# Patient Record
Sex: Female | Born: 1950 | Race: White | Hispanic: No
Health system: Southern US, Community
[De-identification: ages and names within clinical notes are randomized; demographics above are authoritative.]

---

## 1998-03-09 ENCOUNTER — Encounter: Admission: RE | Admit: 1998-03-09 | Discharge: 1998-03-09 | Payer: Self-pay | Admitting: Family Medicine

## 1998-04-20 ENCOUNTER — Encounter: Admission: RE | Admit: 1998-04-20 | Discharge: 1998-04-20 | Payer: Self-pay | Admitting: Family Medicine

## 1999-02-23 ENCOUNTER — Ambulatory Visit (HOSPITAL_COMMUNITY): Admission: RE | Admit: 1999-02-23 | Discharge: 1999-02-23 | Payer: Self-pay | Admitting: Gastroenterology

## 1999-05-18 ENCOUNTER — Other Ambulatory Visit: Admission: RE | Admit: 1999-05-18 | Discharge: 1999-05-18 | Payer: Self-pay | Admitting: Obstetrics and Gynecology

## 1999-06-05 ENCOUNTER — Encounter: Payer: Self-pay | Admitting: Neurosurgery

## 1999-06-05 ENCOUNTER — Ambulatory Visit (HOSPITAL_COMMUNITY): Admission: RE | Admit: 1999-06-05 | Discharge: 1999-06-05 | Payer: Self-pay | Admitting: Neurosurgery

## 2000-05-17 ENCOUNTER — Other Ambulatory Visit: Admission: RE | Admit: 2000-05-17 | Discharge: 2000-05-17 | Payer: Self-pay | Admitting: Obstetrics and Gynecology

## 2000-06-19 ENCOUNTER — Ambulatory Visit (HOSPITAL_COMMUNITY): Admission: RE | Admit: 2000-06-19 | Discharge: 2000-06-19 | Payer: Self-pay | Admitting: Neurosurgery

## 2000-06-19 ENCOUNTER — Encounter: Payer: Self-pay | Admitting: Neurosurgery

## 2001-05-01 ENCOUNTER — Encounter: Admission: RE | Admit: 2001-05-01 | Discharge: 2001-05-01 | Payer: Self-pay | Admitting: Sports Medicine

## 2001-05-09 ENCOUNTER — Other Ambulatory Visit: Admission: RE | Admit: 2001-05-09 | Discharge: 2001-05-09 | Payer: Self-pay | Admitting: Obstetrics and Gynecology

## 2001-06-20 ENCOUNTER — Encounter: Payer: Self-pay | Admitting: Neurosurgery

## 2001-06-20 ENCOUNTER — Ambulatory Visit (HOSPITAL_COMMUNITY): Admission: RE | Admit: 2001-06-20 | Discharge: 2001-06-20 | Payer: Self-pay | Admitting: Neurosurgery

## 2001-08-14 ENCOUNTER — Encounter: Payer: Self-pay | Admitting: Neurosurgery

## 2001-08-14 ENCOUNTER — Ambulatory Visit (HOSPITAL_COMMUNITY): Admission: RE | Admit: 2001-08-14 | Discharge: 2001-08-14 | Payer: Self-pay | Admitting: Neurosurgery

## 2002-03-19 ENCOUNTER — Ambulatory Visit (HOSPITAL_COMMUNITY): Admission: RE | Admit: 2002-03-19 | Discharge: 2002-03-19 | Payer: Self-pay | Admitting: Gastroenterology

## 2002-05-14 ENCOUNTER — Other Ambulatory Visit: Admission: RE | Admit: 2002-05-14 | Discharge: 2002-05-14 | Payer: Self-pay | Admitting: Obstetrics and Gynecology

## 2003-06-04 ENCOUNTER — Other Ambulatory Visit: Admission: RE | Admit: 2003-06-04 | Discharge: 2003-06-04 | Payer: Self-pay | Admitting: Obstetrics and Gynecology

## 2004-06-11 ENCOUNTER — Other Ambulatory Visit: Admission: RE | Admit: 2004-06-11 | Discharge: 2004-06-11 | Payer: Self-pay | Admitting: Obstetrics and Gynecology

## 2005-07-06 ENCOUNTER — Other Ambulatory Visit: Admission: RE | Admit: 2005-07-06 | Discharge: 2005-07-06 | Payer: Self-pay | Admitting: Obstetrics and Gynecology

## 2005-08-10 ENCOUNTER — Other Ambulatory Visit: Admission: RE | Admit: 2005-08-10 | Discharge: 2005-08-10 | Payer: Self-pay | Admitting: Obstetrics and Gynecology

## 2006-02-14 ENCOUNTER — Other Ambulatory Visit: Admission: RE | Admit: 2006-02-14 | Discharge: 2006-02-14 | Payer: Self-pay | Admitting: Obstetrics and Gynecology

## 2006-07-07 ENCOUNTER — Other Ambulatory Visit: Admission: RE | Admit: 2006-07-07 | Discharge: 2006-07-07 | Payer: Self-pay | Admitting: Obstetrics and Gynecology

## 2007-01-05 ENCOUNTER — Other Ambulatory Visit: Admission: RE | Admit: 2007-01-05 | Discharge: 2007-01-05 | Payer: Self-pay | Admitting: Obstetrics and Gynecology

## 2007-07-10 ENCOUNTER — Other Ambulatory Visit: Admission: RE | Admit: 2007-07-10 | Discharge: 2007-07-10 | Payer: Self-pay | Admitting: Obstetrics & Gynecology

## 2008-07-11 ENCOUNTER — Other Ambulatory Visit: Admission: RE | Admit: 2008-07-11 | Discharge: 2008-07-11 | Payer: Self-pay | Admitting: Obstetrics and Gynecology

## 2011-02-01 ENCOUNTER — Other Ambulatory Visit: Payer: Self-pay | Admitting: Radiology

## 2011-02-11 NOTE — Op Note (Signed)
[place]. [facility]  Patient:    KALISI, BEVILL Visit Number: [id number] MRN: [id number]          Service Type: END Location: ENDO Attending Physician:  Orland Mustard Dictated by:   Llana Aliment. Randa Evens, M.D. Proc. Date: 03/19/02 Admit Date:  03/19/2002   CC:         Cynthia P. Ashley Royalty, M.D.  Carolyne Fiscal, M.D.   Operative Report  PROCEDURE:  Colonoscopy.  MEDICATIONS: Fentanyl 125 mcg, Versed 12.5 mg.  SCOPE: Olympus pediatric video colonoscope.  INDICATIONS: Previous history of adenomatous polyps. This is known as the three year followup.  DESCRIPTION OF THE PROCEDURE:  The procedure had been explained to the patient and consent was obtained. The patient was placed in the left lateral decubitus position. The Olympus pediatric video colonoscope was inserted and advanced under direct visualization. The prep was excellent and we were able to reach the cecum using abdominal pressure and position change.  The ileocecal valve and the appendiceal orifice were seen. The scope was withdrawn and the cement and ascending colon, hepatic flexure, transverse colon, splenic flexure, descending and sigmoid colon were seen well upon removal. No polyps were seen. There is no significant diverticular disease.  The scope was withdrawn. The patient tolerated the procedure well.  ASSESSMENT: No evidence of colon polyps.  PLAN: Will recommend repeating the procedure in five years. Dictated by:   Llana Aliment. Randa Evens, M.D. Attending Physician:  Orland Mustard DD:  03/19/02 TD:  03/19/02 Job: [id number]

## 2013-01-15 ENCOUNTER — Telehealth: Payer: Self-pay | Admitting: Obstetrics and Gynecology

## 2013-01-15 NOTE — Telephone Encounter (Signed)
Pt says she need an order for a diagnostic mammogram.

## 2013-01-15 NOTE — Telephone Encounter (Signed)
Spoke to pt who is due for a MMG next month. She had a needle biopsy last May at [facility] and was released from Q25month MMG's last May. She was told this upcoming MMG needed to be a dx. OK to send order for dx to [facility]? Pt has appt 02-13-13.

## 2013-01-15 NOTE — Telephone Encounter (Signed)
Order sent to [facility] for diagnostic Mammogram. Patient notified. sue

## 2013-01-15 NOTE — Telephone Encounter (Signed)
Yes but I would double check with [facility] to make sure the order is what they need.  Thanks.

## 2013-01-16 ENCOUNTER — Telehealth: Payer: Self-pay | Admitting: *Deleted

## 2013-07-19 ENCOUNTER — Ambulatory Visit: Payer: Self-pay | Admitting: Obstetrics and Gynecology

## 2014-07-08 DIAGNOSIS — Z0289 Encounter for other administrative examinations: Secondary | ICD-10-CM

## 2014-07-10 ENCOUNTER — Telehealth: Payer: Self-pay | Admitting: Obstetrics and Gynecology

## 2014-07-10 NOTE — Telephone Encounter (Signed)
Left msg for pt to call regarding her records. Total fee will be $51.25 and when would she like to pick them up?

## 2014-07-10 NOTE — Telephone Encounter (Signed)
THANK YOU

## 2014-07-10 NOTE — Telephone Encounter (Signed)
Pt will pick up records 07/15/14 or 07/16/14

## 2014-07-15 DIAGNOSIS — Z0289 Encounter for other administrative examinations: Secondary | ICD-10-CM

## 2016-01-01 ENCOUNTER — Other Ambulatory Visit: Payer: Self-pay | Admitting: Gastroenterology

## 2016-01-01 DIAGNOSIS — R1084 Generalized abdominal pain: Secondary | ICD-10-CM

## 2016-01-01 DIAGNOSIS — R194 Change in bowel habit: Secondary | ICD-10-CM

## 2016-01-13 ENCOUNTER — Ambulatory Visit
Admission: RE | Admit: 2016-01-13 | Discharge: 2016-01-13 | Disposition: A | Discharge status: Home / Self Care | Payer: Medicare Other | Source: Ambulatory Visit | Attending: Gastroenterology | Admitting: Gastroenterology

## 2016-01-13 ENCOUNTER — Other Ambulatory Visit: Payer: Self-pay

## 2016-01-13 DIAGNOSIS — R194 Change in bowel habit: Secondary | ICD-10-CM

## 2016-01-13 DIAGNOSIS — R1084 Generalized abdominal pain: Secondary | ICD-10-CM

## 2016-01-13 IMAGING — CT CT ABD-PELV W/ CM
1 of 3 series · 14 of 32 positions shown, 19 images · IV contrast (APPLIED)
Comparison: None.

CLINICAL DATA: Generalized abd pain change in bowel habits Bloating
and constipation Since [DATE]

EXAM:
CT ABDOMEN AND PELVIS WITH CONTRAST
TECHNIQUE: Multidetector CT imaging of the abdomen and pelvis was performed
using the standard protocol following bolus administration of
intravenous contrast.
CONTRAST:  100mL [CX] IOPAMIDOL ([CX]) INJECTION 61%

[Series 2: abd/pelvis w/cm · axial · 0.69mm/px · z∈[-436,-51]mm · 14 of 87 slices shown, 19 images]
[im 5/87  soft-tissue]
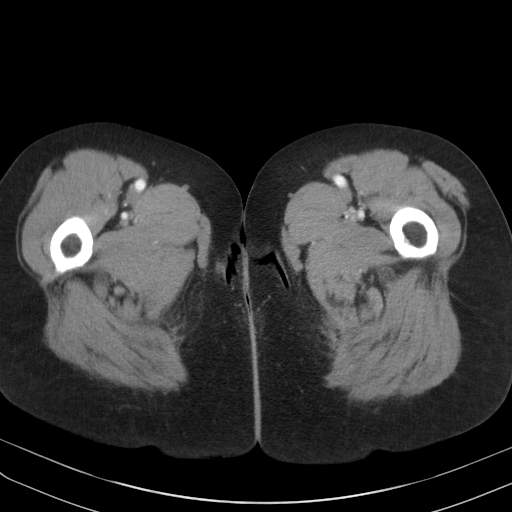
[im 5/87  bone]
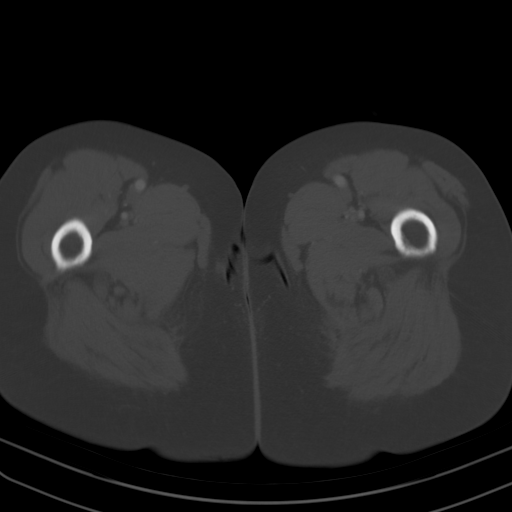
[im 13/87  soft-tissue]
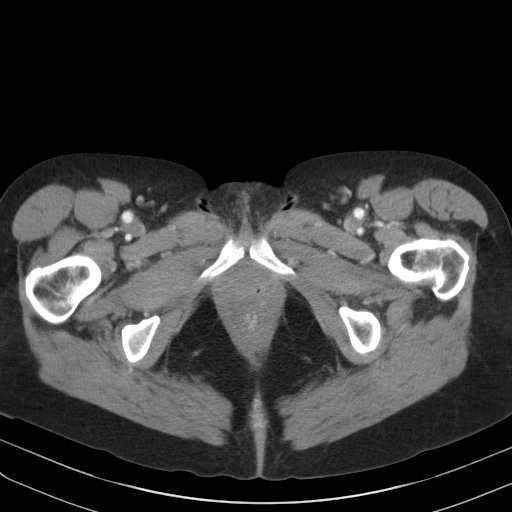
[im 18/87  soft-tissue]
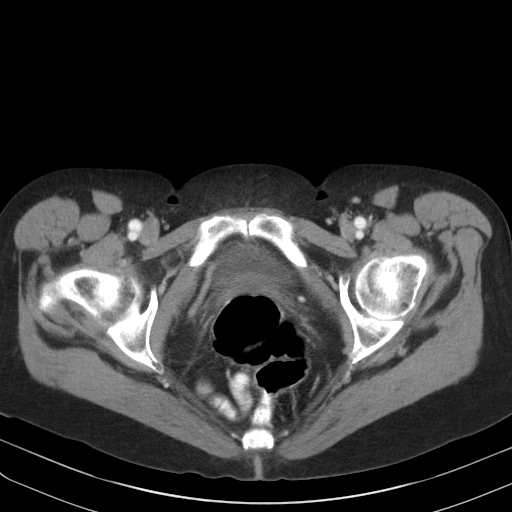
[im 26/87  soft-tissue]
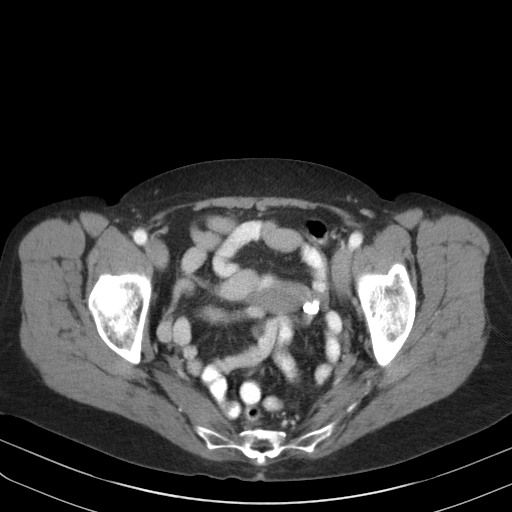
[im 31/87  soft-tissue]
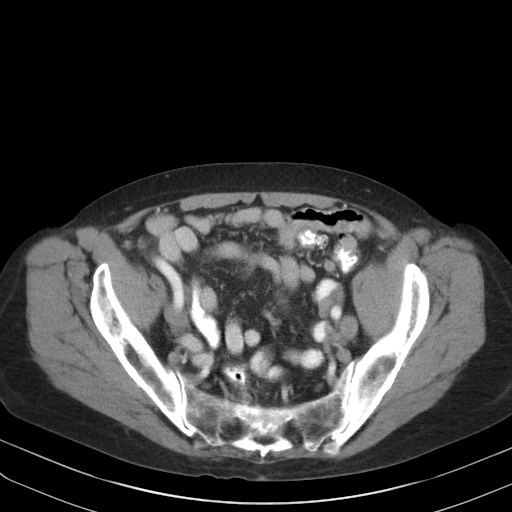
[im 39/87  soft-tissue]
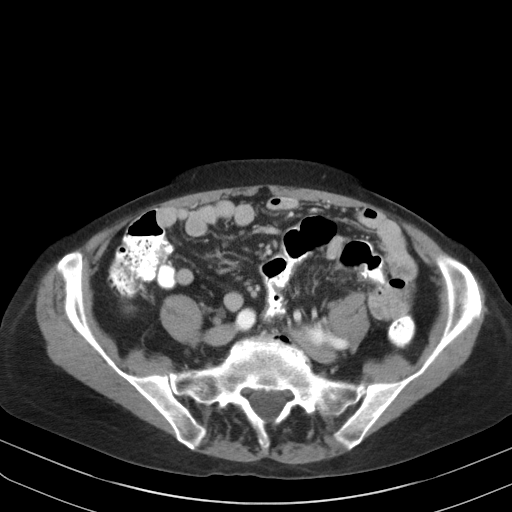
[im 44/87  soft-tissue]
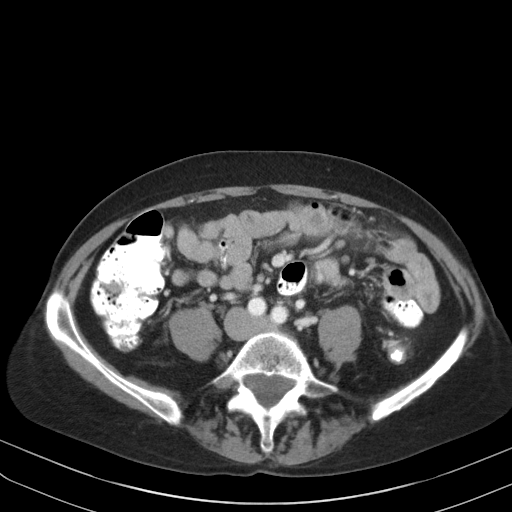
[im 48/87  soft-tissue]
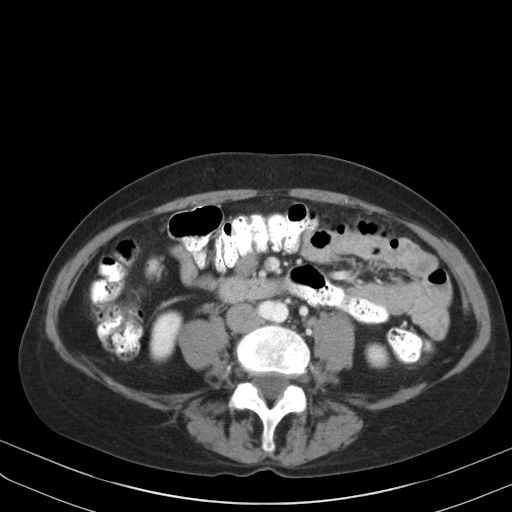
[im 56/87  soft-tissue]
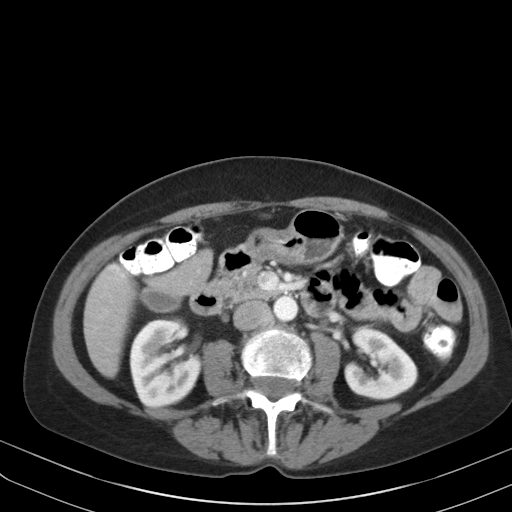
[im 56/87  bone]
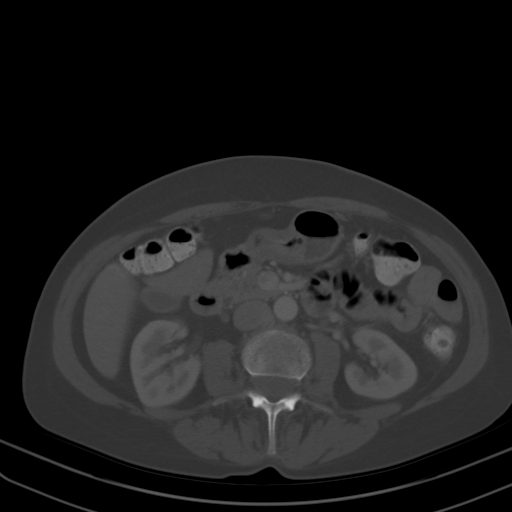
[im 61/87  soft-tissue]
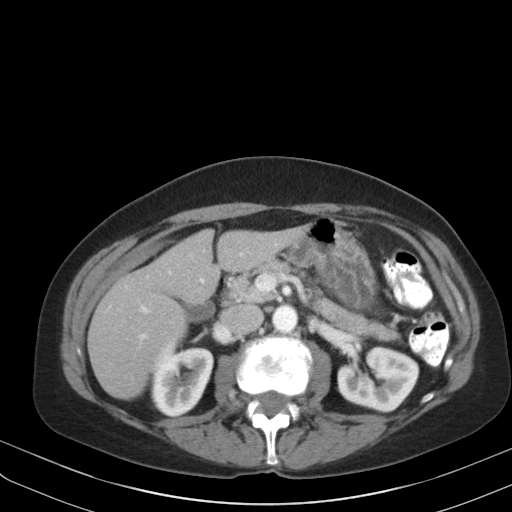
[im 69/87  soft-tissue]
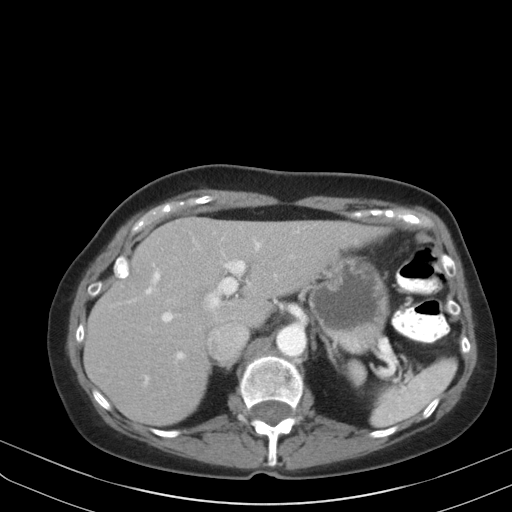
[im 69/87  lung]
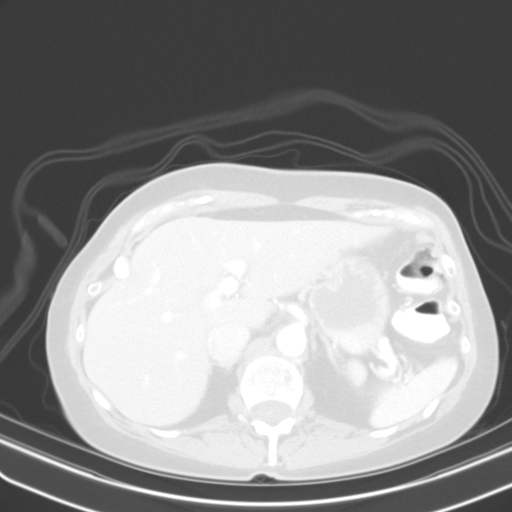
[im 74/87  soft-tissue]
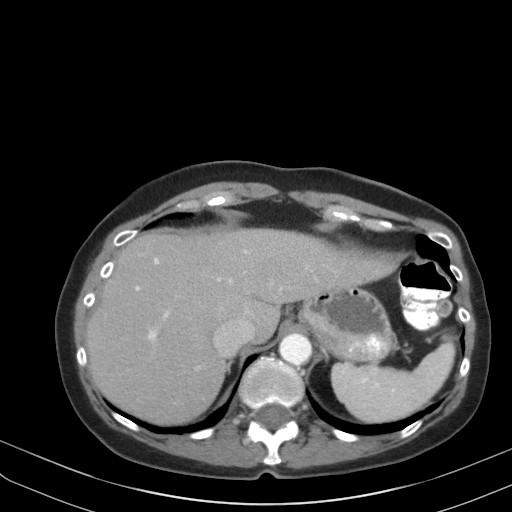
[im 74/87  lung]
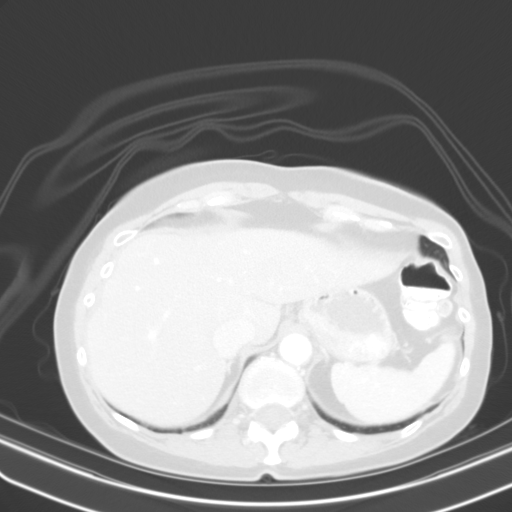
[im 78/87  lung]
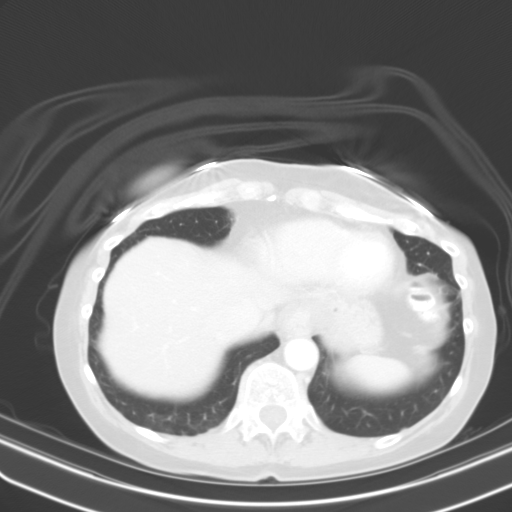
[im 82/87  soft-tissue]
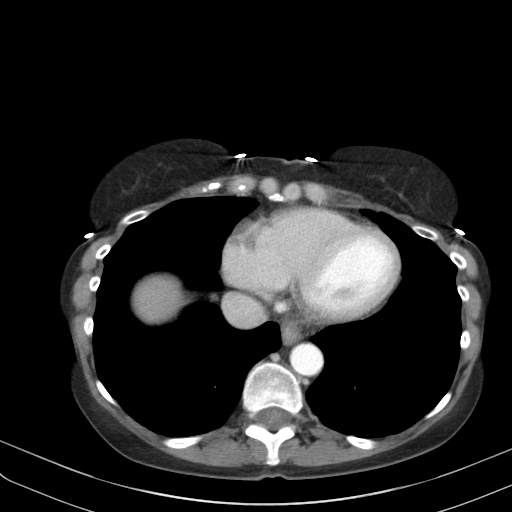
[im 82/87  lung]
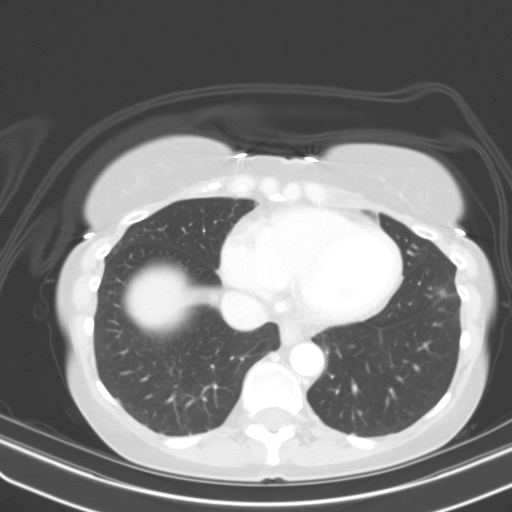

[14 of 32 positions shown; findings below may reference images not displayed]

FINDINGS: Lower chest: Lung bases are clear.

Hepatobiliary: No focal hepatic lesion. No biliary duct dilatation.
Gallbladder is normal. Common bile duct is normal.

Pancreas: Pancreas is normal. No ductal dilatation. No pancreatic
inflammation.

Spleen: Normal spleen

Adrenals/urinary tract: Adrenal glands and kidneys are normal. The
ureters and bladder normal.

Stomach/Bowel: Stomach, small-bowel and cecum are normal. The
appendix is not identified but there is no pericecal inflammation to
suggest appendicitis. The colon and rectosigmoid colon are normal.
Normal volume stool. No obstruction.

Vascular/Lymphatic: Abdominal aorta is normal caliber. There is no
retroperitoneal or periportal lymphadenopathy. No pelvic
lymphadenopathy.

Reproductive: Uterus and ovaries are normal.

Other: No free fluid.

Musculoskeletal: No aggressive osseous lesion.
IMPRESSION: 1. No acute findings in the abdomen pelvis.
2. No abnormality of the bowel identified.

## 2016-01-13 MED ORDER — IOPAMIDOL (ISOVUE-300) INJECTION 61%
100.0000 mL | Freq: Once | INTRAVENOUS | Status: AC | PRN
  Administered 2016-01-13: 100 mL via INTRAVENOUS
